# Patient Record
Sex: Male | Born: 1955 | Race: White | Hispanic: No | Smoking: Never smoker
Health system: Southern US, Community
[De-identification: ages and names within clinical notes are randomized; demographics above are authoritative.]

## PROBLEM LIST (undated history)

## (undated) DIAGNOSIS — M2550 Pain in unspecified joint: Secondary | ICD-10-CM

## (undated) HISTORY — PX: OTHER SURGICAL HISTORY: SHX169

## (undated) HISTORY — PX: COLONOSCOPY: SHX174

---

## 2014-02-22 ENCOUNTER — Emergency Department (HOSPITAL_COMMUNITY)

## 2014-02-22 ENCOUNTER — Emergency Department (HOSPITAL_COMMUNITY)
Admission: EM | Admit: 2014-02-22 | Discharge: 2014-02-22 | Disposition: A | Discharge status: Home / Self Care | Attending: Emergency Medicine | Admitting: Emergency Medicine

## 2014-02-22 ENCOUNTER — Encounter (HOSPITAL_COMMUNITY): Payer: Self-pay | Admitting: Emergency Medicine

## 2014-02-22 DIAGNOSIS — W000XXA Fall on same level due to ice and snow, initial encounter: Secondary | ICD-10-CM | POA: Diagnosis not present

## 2014-02-22 DIAGNOSIS — Y998 Other external cause status: Secondary | ICD-10-CM | POA: Diagnosis not present

## 2014-02-22 DIAGNOSIS — Y9289 Other specified places as the place of occurrence of the external cause: Secondary | ICD-10-CM | POA: Insufficient documentation

## 2014-02-22 DIAGNOSIS — Y9301 Activity, walking, marching and hiking: Secondary | ICD-10-CM | POA: Diagnosis not present

## 2014-02-22 DIAGNOSIS — S82842A Displaced bimalleolar fracture of left lower leg, initial encounter for closed fracture: Secondary | ICD-10-CM | POA: Insufficient documentation

## 2014-02-22 DIAGNOSIS — S99912A Unspecified injury of left ankle, initial encounter: Secondary | ICD-10-CM | POA: Diagnosis present

## 2014-02-22 MED ORDER — OXYCODONE-ACETAMINOPHEN 5-325 MG PO TABS
1.0000 | ORAL_TABLET | Freq: Once | ORAL | Status: AC
  Administered 2014-02-22: 1 via ORAL
  Filled 2014-02-22: qty 1

## 2014-02-22 MED ORDER — ONDANSETRON 4 MG PO TBDP
4.0000 mg | ORAL_TABLET | Freq: Once | ORAL | Status: DC
  Filled 2014-02-22: qty 1

## 2014-02-22 MED ORDER — ONDANSETRON HCL 4 MG PO TABS: 4.0000 mg | ORAL_TABLET | Freq: Four times a day (QID) | ORAL | Status: AC

## 2014-02-22 MED ORDER — IBUPROFEN 600 MG PO TABS: 600.0000 mg | ORAL_TABLET | Freq: Four times a day (QID) | ORAL | Status: DC | PRN

## 2014-02-22 MED ORDER — DOCUSATE SODIUM 250 MG PO CAPS: 250.0000 mg | ORAL_CAPSULE | Freq: Every day | ORAL | Status: AC

## 2014-02-22 MED ORDER — HYDROCODONE-ACETAMINOPHEN 5-325 MG PO TABS: 1.0000 | ORAL_TABLET | ORAL | Status: DC | PRN

## 2014-02-22 MED ORDER — IBUPROFEN 800 MG PO TABS
800.0000 mg | ORAL_TABLET | Freq: Once | ORAL | Status: AC
  Administered 2014-02-22: 800 mg via ORAL
  Filled 2014-02-22: qty 1

## 2014-02-22 NOTE — ED Notes (Signed)
Patient transported to X-ray 

## 2014-02-22 NOTE — ED Notes (Signed)
Pt had fall this morning on ice, c/o left foot and ankle pain. No deformity noted.

## 2014-02-22 NOTE — Discharge Instructions (Signed)
°  with your health care provider.

## 2014-02-22 NOTE — ED Provider Notes (Signed)
CSN: [id number]     Arrival date & time 02/22/14  4098 History   First MD Initiated Contact with Patient 02/22/14 [id number]     Chief Complaint  Patient presents with  . Fall  . Ankle Pain    left     (Consider location/radiation/quality/duration/timing/severity/associated sxs/prior Treatment) HPI   PCP: No primary care provider on file. Blood pressure 114/92, pulse 76, temperature 97.9 F (36.6 C), temperature source Oral, resp. rate 20, SpO2 100 %.  Jack Jones is a 59 y.o.male without any significant PMH presents to the ER with complaints of pain in the left ankle. The patient was walking into work when he slipped on a patch of dry ice causing him to fall and twist his left ankle. He denies hitting his head, injuring his neck, loss consciousness or any other complaints. He's been able to ambulate but with pain to the left ankle. He reports then like something is loose. The incident happened around 6:30 this morning. He originally went to urgent care but they do not open until 10 AM and he was concerned that his injury make it worse if he waited. He denies having any pain at rest. Has not taken anything for pain. He denies numbness or coldness to the foot.   History reviewed. No pertinent past medical history. History reviewed. No pertinent past surgical history. No family history on file. History  Substance Use Topics  . Smoking status: Never Smoker   . Smokeless tobacco: Not on file  . Alcohol Use: No    Review of Systems 10 Systems reviewed and are negative for acute change except as noted in the HPI.    Allergies  Review of patient's allergies indicates no known allergies.  Home Medications   Prior to Admission medications   Medication Sig Start Date End Date Taking? Authorizing Provider  docusate sodium (COLACE) 250 MG capsule Take 1 capsule (250 mg total) by mouth daily. 02/22/14   Dorthula Matas, PA-C  HYDROcodone-acetaminophen (NORCO/VICODIN) 5-325 MG per tablet  Take 1-2 tablets by mouth every 4 (four) hours as needed. 02/22/14   Estreya Clay Irine Seal, PA-C  ibuprofen (ADVIL,MOTRIN) 600 MG tablet Take 1 tablet (600 mg total) by mouth every 6 (six) hours as needed. 02/22/14   Hildreth Orsak Irine Seal, PA-C  ondansetron (ZOFRAN) 4 MG tablet Take 1 tablet (4 mg total) by mouth every 6 (six) hours. 02/22/14   Ladona Rosten Irine Seal, PA-C   BP 114/92 mmHg  Pulse 76  Temp(Src) 97.9 F (36.6 C) (Oral)  Resp 20  SpO2 100% Physical Exam  Constitutional: He appears well-developed and well-nourished. No distress.  HENT:  Head: Normocephalic and atraumatic. Head is without raccoon's eyes, without abrasion, without contusion, without laceration, without right periorbital erythema and without left periorbital erythema.  Right Ear: Tympanic membrane and ear canal normal.  Left Ear: Tympanic membrane and ear canal normal.  Nose: Nose normal.  Mouth/Throat: Uvula is midline and oropharynx is clear and moist.  Eyes: Pupils are equal, round, and reactive to light.  Neck: Normal range of motion. Neck supple.  Cardiovascular: Normal rate and regular rhythm.   Pulmonary/Chest: Effort normal.  Abdominal: Soft.  Musculoskeletal:       Left ankle: He exhibits decreased range of motion (due to pain and swelling) and swelling (lateral malleouli). He exhibits no deformity, no laceration and normal pulse. Tenderness. Lateral malleolus and medial malleolus tenderness found. Achilles tendon normal.  Pedal pulses are symmetrical. Foot is warm and moist. Sensations intact.  Neurological: He is alert.  Pt alert and oriented x 3 Upper and lower extremity strength is symmetrical and physiologic Normal muscular tone No facial droop   Skin: Skin is warm and dry.  Nursing note and vitals reviewed.   ED Course  Procedures (including critical care time) Labs Review Labs Reviewed - No data to display  Imaging Review Dg Ankle Complete Left  02/22/2014   ADDENDUM REPORT: 02/22/2014 10:22   ADDENDUM: Voice recognition error: The first sentence of the impression section should read "Bimalleolar fractures, oblique laterally and transverse medially. There is mild displacement of both fractures."   Electronically Signed   By: Gennette Pac M.D.   On: 02/22/2014 10:22   02/22/2014   CLINICAL DATA:  Larey Seat in parking lot at 7:30 a.m. today. Swelling of the left ankle. Fall from a standing position.  EXAM: LEFT ANKLE COMPLETE - 3+ VIEW  COMPARISON:  None.  FINDINGS: Bimalleolar fractures are slightly displaced. The lateral malleolar fracture is oblique. This suggests an eversion injury. Extensive soft tissue swelling is noted bilaterally. The ankle joint is located. No additional fractures are present. Prominent calcaneal spurs are noted.  IMPRESSION: 1. Bilateral now nail lower fractures, oblique laterally and transverse medially. There is mild displacement of both fractures. 2. The ankle joint is located. 3. Extensive soft tissue swelling is present.  Electronically Signed: By: Gennette Pac M.D. On: 02/22/2014 09:53   Dg Foot Complete Left  02/22/2014   CLINICAL DATA:  Recent fall with ankle pain and swelling, initial encounter  EXAM: LEFT FOOT - COMPLETE 3+ VIEW  COMPARISON:  None.  FINDINGS: The known distal tibial and fibular fractures are again seen. Generalized soft tissue swelling about the ankle is noted. No fractures are seen within the foot. Calcaneal spurs are noted.  IMPRESSION: Tibial and fibular fractures better evaluated on recent ankle images.  No other focal abnormality is noted.   Electronically Signed   By: Alcide Clever M.D.   On: 02/22/2014 09:53     EKG Interpretation None      MDM   Final diagnoses:  Bimalleolar ankle fracture, left, closed, initial encounter    Medications  oxyCODONE-acetaminophen (PERCOCET/ROXICET) 5-325 MG per tablet 1 tablet (not administered)  ondansetron (ZOFRAN-ODT) disintegrating tablet 4 mg (not administered)  ibuprofen (ADVIL,MOTRIN)  tablet 800 mg (800 mg Oral Given 02/22/14 0953)    Patient has a bimalleolar fracture. He has been given ice. I spoke with the orthopedic doctor who recommends posterior stirrup, heavy [padding, crutches, elevation, RICE. He is to call the office today to schedule follow-up appointment he will need surgery. Patient does not need relocation or emergent surgery as the ankle is only mildly displaced with no ankle joint dislocation. Large amount of swelling.  Discussed plan with patient and that the fracture is surgical, he has been given the orthopedics strict instructions.  59 y.o.Linard Millers Asare's evaluation in the Emergency Department is complete. It has been determined that no acute conditions requiring further emergency intervention are present at this time. The patient/guardian have been advised of the diagnosis and plan. We have discussed signs and symptoms that warrant return to the ED, such as changes or worsening in symptoms.  Vital signs are stable at discharge. Filed Vitals:   02/22/14 0853  BP: 114/92  Pulse: 76  Temp: 97.9 F (36.6 C)  Resp: 20    Patient/guardian has voiced understanding and agreed to follow-up with the PCP or specialist.    Dorthula Matas, PA-C 02/22/14  [address]  Mkenzie Dotts G Kamin Niblack, PA-C 02/22/14 1102  Suzi RootsKevin E Steinl, MD 02/23/14 [phone]

## 2014-03-01 ENCOUNTER — Other Ambulatory Visit (HOSPITAL_COMMUNITY): Payer: Self-pay | Admitting: Orthopaedic Surgery

## 2014-03-01 ENCOUNTER — Encounter (HOSPITAL_COMMUNITY): Payer: Self-pay

## 2014-03-01 ENCOUNTER — Encounter (HOSPITAL_COMMUNITY)
Admission: RE | Admit: 2014-03-01 | Discharge: 2014-03-01 | Disposition: A | Discharge status: Home / Self Care | Source: Ambulatory Visit | Attending: Orthopaedic Surgery | Admitting: Orthopaedic Surgery

## 2014-03-01 DIAGNOSIS — W000XXA Fall on same level due to ice and snow, initial encounter: Secondary | ICD-10-CM | POA: Insufficient documentation

## 2014-03-01 DIAGNOSIS — Z01812 Encounter for preprocedural laboratory examination: Secondary | ICD-10-CM | POA: Diagnosis present

## 2014-03-01 DIAGNOSIS — S82842A Displaced bimalleolar fracture of left lower leg, initial encounter for closed fracture: Secondary | ICD-10-CM | POA: Insufficient documentation

## 2014-03-01 HISTORY — DX: Pain in unspecified joint: M25.50

## 2014-03-01 LAB — SURGICAL PCR SCREEN
MRSA, PCR: NEGATIVE
Staphylococcus aureus: NEGATIVE

## 2014-03-01 LAB — CBC
HEMATOCRIT: 40.4 % (ref 39.0–52.0)
Hemoglobin: 13.5 g/dL (ref 13.0–17.0)
MCH: 30.2 pg (ref 26.0–34.0)
MCHC: 33.4 g/dL (ref 30.0–36.0)
MCV: 90.4 fL (ref 78.0–100.0)
Platelets: 169 10*3/uL (ref 150–400)
RBC: 4.47 MIL/uL (ref 4.22–5.81)
RDW: 12.2 % (ref 11.5–15.5)
WBC: 6.4 10*3/uL (ref 4.0–10.5)

## 2014-03-01 NOTE — Pre-Procedure Instructions (Signed)
Jack Jones  03/01/2014   Your procedure is scheduled on:  Tues, Feb 2 @ 3:10 PM  [facility]:00 PM.  Call this number if you have problems the morning of surgery: [phone]   Remember:   Do not eat food or drink liquids after midnight.   Take these medicines the morning of surgery with A SIP OF WATER: Pain Pill(if needed) and Zofran(Ondansetron-if needed)              Stop taking your Ibuprofen. No Goody's,BC's,Aleve,Aspirin,Fish Oil,or any Herbal Medications   Do not wear jewelry  Do not wear lotions, powders, or colognes. You may wear deodorant.  Men may shave face and neck.  Do not bring valuables to the hospital.  [facility] is not responsible                  for any belongings or valuables.               Contacts, dentures or bridgework may not be worn into surgery.  Leave suitcase in the car. After surgery it may be brought to your room.  For patients admitted to the hospital, discharge time is determined by your                treatment team.               Patients discharged the day of surgery will not be allowed to drive  home.    Special Instructions:  [facility] - Preparing for Surgery  Before surgery, you can play an important role.  Because skin is not sterile, your skin needs to be as free of germs as possible.  You can reduce the number of germs on you skin by washing with CHG (chlorahexidine gluconate) soap before surgery.  CHG is an antiseptic cleaner which kills germs and bonds with the skin to continue killing germs even after washing.  Please DO NOT use if you have an allergy to CHG or antibacterial soaps.  If your skin becomes reddened/irritated stop using the CHG and inform your nurse when you arrive at Short Stay.  Do not shave (including legs and underarms) for at least 48 hours prior to the first CHG shower.  You may shave your face.  Please follow these instructions carefully:   1.  Shower with CHG Soap the night before  surgery and the                                morning of Surgery.  2.  If you choose to wash your hair, wash your hair first as usual with your       normal shampoo.  3.  After you shampoo, rinse your hair and body thoroughly to remove the                      Shampoo.  4.  Use CHG as you would any other liquid soap.  You can apply chg directly       to the skin and wash gently with scrungie or a clean washcloth.  5.  Apply the CHG Soap to your body ONLY FROM THE NECK DOWN.        Do not use on open wounds or open sores.  Avoid contact with your eyes,       ears, mouth and genitals (private parts).  Wash genitals (private parts)       with your normal soap.  6.  Wash thoroughly, paying special attention to the area where your surgery        will be performed.  7.  Thoroughly rinse your body with warm water from the neck down.  8.  DO NOT shower/wash with your normal soap after using and rinsing off       the CHG Soap.  9.  Pat yourself dry with a clean towel.            10.  Wear clean pajamas.            11.  Place clean sheets on your bed the night of your first shower and do not        sleep with pets.  Day of Surgery  Do not apply any lotions/deoderants the morning of surgery.  Please wear clean clothes to the hospital/surgery center.     Please read over the following fact sheets that you were given: Coughing and Deep Breathing, MRSA Information and Surgical Site Infection Prevention

## 2014-03-01 NOTE — Progress Notes (Addendum)
Medical Md is Dr.Richard Escajeda  Pt doesn't have a cardiologist  Denies ever having an echo/stress test/heart cath  Denies EKG or CXR in past yr

## 2014-03-02 ENCOUNTER — Ambulatory Visit (HOSPITAL_COMMUNITY)
Admission: RE | Admit: 2014-03-02 | Discharge: 2014-03-02 | Disposition: A | Discharge status: Home / Self Care | Source: Ambulatory Visit | Attending: Orthopaedic Surgery | Admitting: Orthopaedic Surgery

## 2014-03-02 ENCOUNTER — Encounter (HOSPITAL_COMMUNITY): Payer: Self-pay

## 2014-03-02 ENCOUNTER — Ambulatory Visit (HOSPITAL_COMMUNITY): Admitting: Anesthesiology

## 2014-03-02 ENCOUNTER — Ambulatory Visit (HOSPITAL_COMMUNITY)

## 2014-03-02 ENCOUNTER — Encounter (HOSPITAL_COMMUNITY)
Admission: RE | Disposition: A | Discharge status: Home / Self Care | Payer: Self-pay | Source: Ambulatory Visit | Attending: Orthopaedic Surgery

## 2014-03-02 DIAGNOSIS — S82842A Displaced bimalleolar fracture of left lower leg, initial encounter for closed fracture: Secondary | ICD-10-CM

## 2014-03-02 DIAGNOSIS — Z419 Encounter for procedure for purposes other than remedying health state, unspecified: Secondary | ICD-10-CM

## 2014-03-02 DIAGNOSIS — W000XXA Fall on same level due to ice and snow, initial encounter: Secondary | ICD-10-CM | POA: Insufficient documentation

## 2014-03-02 HISTORY — PX: ORIF ANKLE FRACTURE: SHX5408

## 2014-03-02 SURGERY — OPEN REDUCTION INTERNAL FIXATION (ORIF) ANKLE FRACTURE
Anesthesia: Regional | Laterality: Left

## 2014-03-02 MED ORDER — FENTANYL CITRATE 0.05 MG/ML IJ SOLN
INTRAMUSCULAR | Status: AC
  Filled 2014-03-02: qty 5

## 2014-03-02 MED ORDER — ROPIVACAINE HCL 5 MG/ML IJ SOLN
INTRAMUSCULAR | Status: DC | PRN
  Administered 2014-03-02: 30 mL via PERINEURAL
  Administered 2014-03-02: 15 mL via PERINEURAL

## 2014-03-02 MED ORDER — CEFAZOLIN SODIUM-DEXTROSE 2-3 GM-% IV SOLR
INTRAVENOUS | Status: AC
  Administered 2014-03-02: 2 g via INTRAVENOUS
  Filled 2014-03-02: qty 50

## 2014-03-02 MED ORDER — OXYCODONE HCL 5 MG PO TABS
5.0000 mg | ORAL_TABLET | Freq: Once | ORAL | Status: DC | PRN
Start: 2014-03-02 — End: 2014-03-02

## 2014-03-02 MED ORDER — LACTATED RINGERS IV SOLN
INTRAVENOUS | Status: DC | PRN
  Administered 2014-03-02 (×2): via INTRAVENOUS

## 2014-03-02 MED ORDER — ACETAMINOPHEN 325 MG PO TABS: 325.0000 mg | ORAL_TABLET | ORAL | Status: DC | PRN

## 2014-03-02 MED ORDER — OXYCODONE HCL 5 MG/5ML PO SOLN: 5.0000 mg | Freq: Once | ORAL | Status: DC | PRN

## 2014-03-02 MED ORDER — LACTATED RINGERS IV SOLN
INTRAVENOUS | Status: DC
  Administered 2014-03-02: 13:00:00 via INTRAVENOUS

## 2014-03-02 MED ORDER — OXYCODONE-ACETAMINOPHEN 5-325 MG PO TABS: 1.0000 | ORAL_TABLET | ORAL | Status: AC | PRN

## 2014-03-02 MED ORDER — PHENYLEPHRINE HCL 10 MG/ML IJ SOLN
INTRAMUSCULAR | Status: DC | PRN
  Administered 2014-03-02: 40 ug via INTRAVENOUS

## 2014-03-02 MED ORDER — LIDOCAINE HCL (CARDIAC) 20 MG/ML IV SOLN
INTRAVENOUS | Status: DC | PRN
  Administered 2014-03-02: 70 mg via INTRAVENOUS

## 2014-03-02 MED ORDER — HYDROMORPHONE HCL 1 MG/ML IJ SOLN: 0.2500 mg | INTRAMUSCULAR | Status: DC | PRN

## 2014-03-02 MED ORDER — EPHEDRINE SULFATE 50 MG/ML IJ SOLN
INTRAMUSCULAR | Status: DC | PRN
  Administered 2014-03-02: 10 mg via INTRAVENOUS
  Administered 2014-03-02 (×8): 5 mg via INTRAVENOUS

## 2014-03-02 MED ORDER — MIDAZOLAM HCL 5 MG/5ML IJ SOLN
INTRAMUSCULAR | Status: DC | PRN
  Administered 2014-03-02: 2 mg via INTRAVENOUS

## 2014-03-02 MED ORDER — ACETAMINOPHEN 160 MG/5ML PO SOLN
325.0000 mg | ORAL | Status: DC | PRN
  Filled 2014-03-02: qty 20.3

## 2014-03-02 MED ORDER — MIDAZOLAM HCL 2 MG/2ML IJ SOLN
INTRAMUSCULAR | Status: AC
  Filled 2014-03-02: qty 2

## 2014-03-02 MED ORDER — ONDANSETRON HCL 4 MG/2ML IJ SOLN
INTRAMUSCULAR | Status: DC | PRN
  Administered 2014-03-02: 4 mg via INTRAVENOUS

## 2014-03-02 MED ORDER — PROPOFOL 10 MG/ML IV BOLUS
INTRAVENOUS | Status: DC | PRN
  Administered 2014-03-02: 180 mg via INTRAVENOUS

## 2014-03-02 MED ORDER — BUPIVACAINE HCL (PF) 0.25 % IJ SOLN
INTRAMUSCULAR | Status: AC
  Filled 2014-03-02: qty 30

## 2014-03-02 MED ORDER — CEFAZOLIN SODIUM-DEXTROSE 2-3 GM-% IV SOLR: 2.0000 g | INTRAVENOUS | Status: DC

## 2014-03-02 MED ORDER — FENTANYL CITRATE 0.05 MG/ML IJ SOLN
INTRAMUSCULAR | Status: DC | PRN
  Administered 2014-03-02: 50 ug via INTRAVENOUS

## 2014-03-02 MED ORDER — 0.9 % SODIUM CHLORIDE (POUR BTL) OPTIME
TOPICAL | Status: DC | PRN
  Administered 2014-03-02: 1000 mL

## 2014-03-02 MED ORDER — ASPIRIN EC 325 MG PO TBEC: 325.0000 mg | DELAYED_RELEASE_TABLET | Freq: Every day | ORAL | Status: AC

## 2014-03-02 SURGICAL SUPPLY — 69 items
BANDAGE ELASTIC 4 VELCRO ST LF (GAUZE/BANDAGES/DRESSINGS) IMPLANT
BANDAGE ELASTIC 6 VELCRO ST LF (GAUZE/BANDAGES/DRESSINGS) IMPLANT
BANDAGE ESMARK 6X9 LF (GAUZE/BANDAGES/DRESSINGS) IMPLANT
BIT DRILL 2.5X110 QC LCP DISP (BIT) ×2 IMPLANT
BIT DRILL CANN 2.7X625 NONSTRL (BIT) ×3 IMPLANT
BNDG CMPR 9X6 STRL LF SNTH (GAUZE/BANDAGES/DRESSINGS)
BNDG ESMARK 6X9 LF (GAUZE/BANDAGES/DRESSINGS)
BNDG GAUZE ELAST 4 BULKY (GAUZE/BANDAGES/DRESSINGS) ×4 IMPLANT
COVER SURGICAL LIGHT HANDLE (MISCELLANEOUS) ×3 IMPLANT
CUFF TOURNIQUET SINGLE 34IN LL (TOURNIQUET CUFF) IMPLANT
CUFF TOURNIQUET SINGLE 44IN (TOURNIQUET CUFF) IMPLANT
DRAPE C-ARM 42X72 X-RAY (DRAPES) ×3 IMPLANT
DRAPE U-SHAPE 47X51 STRL (DRAPES) ×3 IMPLANT
DRSG PAD ABDOMINAL 8X10 ST (GAUZE/BANDAGES/DRESSINGS) ×4 IMPLANT
DURAPREP 26ML APPLICATOR (WOUND CARE) ×3 IMPLANT
ELECT REM PT RETURN 9FT ADLT (ELECTROSURGICAL) ×3
ELECTRODE REM PT RTRN 9FT ADLT (ELECTROSURGICAL) ×1 IMPLANT
GAUZE SPONGE 4X4 12PLY STRL (GAUZE/BANDAGES/DRESSINGS) ×1 IMPLANT
GAUZE XEROFORM 5X9 LF (GAUZE/BANDAGES/DRESSINGS) ×3 IMPLANT
GLOVE BIO SURGEON STRL SZ8 (GLOVE) ×1 IMPLANT
GLOVE BIOGEL PI IND STRL 8 (GLOVE) IMPLANT
GLOVE BIOGEL PI INDICATOR 8 (GLOVE) ×4
GLOVE ORTHO TXT STRL SZ7.5 (GLOVE) ×3 IMPLANT
GLOVE SURG SS PI 6.5 STRL IVOR (GLOVE) ×2 IMPLANT
GLOVE SURG SS PI 7.0 STRL IVOR (GLOVE) ×6 IMPLANT
GOWN STRL REUS W/ TWL LRG LVL3 (GOWN DISPOSABLE) ×2 IMPLANT
GOWN STRL REUS W/ TWL XL LVL3 (GOWN DISPOSABLE) ×4 IMPLANT
GOWN STRL REUS W/TWL LRG LVL3 (GOWN DISPOSABLE)
GOWN STRL REUS W/TWL XL LVL3 (GOWN DISPOSABLE) ×6
GUIDEWIRE THREADED 150MM (WIRE) ×2 IMPLANT
KIT BASIN OR (CUSTOM PROCEDURE TRAY) ×3 IMPLANT
KIT ROOM TURNOVER OR (KITS) ×3 IMPLANT
MANIFOLD NEPTUNE II (INSTRUMENTS) ×1 IMPLANT
NEEDLE HYPO 25GX1X1/2 BEV (NEEDLE) IMPLANT
NS IRRIG 1000ML POUR BTL (IV SOLUTION) ×3 IMPLANT
PACK ORTHO EXTREMITY (CUSTOM PROCEDURE TRAY) ×3 IMPLANT
PAD ARMBOARD 7.5X6 YLW CONV (MISCELLANEOUS) ×6 IMPLANT
PAD CAST 4YDX4 CTTN HI CHSV (CAST SUPPLIES) ×2 IMPLANT
PADDING CAST COTTON 4X4 STRL (CAST SUPPLIES) ×6
PADDING CAST COTTON 6X4 STRL (CAST SUPPLIES) ×3 IMPLANT
PLATE LCP 3.5 1/3 TUB 6HX69 (Plate) ×2 IMPLANT
PREFILTER NEPTUNE (MISCELLANEOUS) IMPLANT
SCREW CANC PT/18 4.0 (Screw) ×2 IMPLANT
SCREW CANCEL 4.0 16MM (Screw) ×2 IMPLANT
SCREW CANN S THRD/50 4.0 (Screw) ×6 IMPLANT
SCREW CORTEX 3.5 12MM (Screw) ×2 IMPLANT
SCREW CORTEX 3.5 14MM (Screw) ×2 IMPLANT
SCREW CORTEX 3.5 18MM (Screw) ×2 IMPLANT
SCREW LOCK CORT ST 3.5X12 (Screw) IMPLANT
SCREW LOCK CORT ST 3.5X14 (Screw) IMPLANT
SCREW LOCK CORT ST 3.5X18 (Screw) ×1 IMPLANT
SPONGE GAUZE 4X4 12PLY STER LF (GAUZE/BANDAGES/DRESSINGS) ×2 IMPLANT
SPONGE LAP 4X18 X RAY DECT (DISPOSABLE) ×2 IMPLANT
SUCTION FRAZIER TIP 10 FR DISP (SUCTIONS) ×3 IMPLANT
SUT ETHILON 2 0 FS 18 (SUTURE) ×3 IMPLANT
SUT ETHILON 3 0 PS 1 (SUTURE) ×6 IMPLANT
SUT ETHILON 4 0 PS 2 18 (SUTURE) ×4 IMPLANT
SUT VIC AB 0 CT1 27 (SUTURE) ×3
SUT VIC AB 0 CT1 27XBRD ANBCTR (SUTURE) ×1 IMPLANT
SUT VIC AB 2-0 CT1 27 (SUTURE) ×6
SUT VIC AB 2-0 CT1 27XBRD (SUTURE) ×1 IMPLANT
SUT VIC AB 2-0 CT1 TAPERPNT 27 (SUTURE) IMPLANT
SUT VICRYL 0 CT 1 36IN (SUTURE) ×3 IMPLANT
SYR CONTROL 10ML LL (SYRINGE) IMPLANT
TOWEL OR 17X24 6PK STRL BLUE (TOWEL DISPOSABLE) ×3 IMPLANT
TOWEL OR 17X26 10 PK STRL BLUE (TOWEL DISPOSABLE) ×3 IMPLANT
TUBE CONNECTING 12'X1/4 (SUCTIONS) ×1
TUBE CONNECTING 12X1/4 (SUCTIONS) ×2 IMPLANT
WATER STERILE IRR 1000ML POUR (IV SOLUTION) ×3 IMPLANT

## 2014-03-02 NOTE — Discharge Instructions (Signed)
Elevation and ice for swelling. No weight at all on your left ankle. Keep your splint clean and dry.  General Anesthesia, Adult, Care After  Refer to this sheet in the next few weeks. These instructions provide you with information on caring for yourself after your procedure. Your health care provider may also give you more specific instructions. Your treatment has been planned according to current medical practices, but problems sometimes occur. Call your health care provider if you have any problems or questions after your procedure.  WHAT TO EXPECT AFTER THE PROCEDURE  After the procedure, it is typical to experience:  Sleepiness.  Nausea and vomiting. HOME CARE INSTRUCTIONS  For the first 24 hours after general anesthesia:  Have a responsible person with you.  Do not drive a car. If you are alone, do not take public transportation.  Do not drink alcohol.  Do not take medicine that has not been prescribed by your health care provider.  Do not sign important papers or make important decisions.  You may resume a normal diet and activities as directed by your health care provider.  Change bandages (dressings) as directed.  If you have questions or problems that seem related to general anesthesia, call the hospital and ask for the anesthetist or anesthesiologist on call. SEEK MEDICAL CARE IF:  You have nausea and vomiting that continue the day after anesthesia.  You develop a rash. SEEK IMMEDIATE MEDICAL CARE IF:  You have difficulty breathing.  You have chest pain.  You have any allergic problems. Document Released: 04/23/2000 Document Revised: 09/17/2012 Document Reviewed: 07/31/2012  [employer] Patient Information 2014 [employer] [place].   What to eat:  For your first meals, you should eat lightly; only small meals initially.  If you do not have nausea, you may eat larger meals.  Avoid spicy, greasy and heavy food.

## 2014-03-02 NOTE — Anesthesia Preprocedure Evaluation (Signed)
Anesthesia Evaluation  Patient identified by MRN, date of birth, ID band Patient awake    Reviewed: Allergy & Precautions, NPO status , Patient's Chart, lab work & pertinent test results  History of Anesthesia Complications Negative for: history of anesthetic complications  Airway Mallampati: II  TM Distance: >3 FB Neck ROM: Full    Dental  (+) Teeth Intact   Pulmonary neg pulmonary ROS,  breath sounds clear to auscultation        Cardiovascular negative cardio ROS  Rhythm:Regular     Neuro/Psych negative neurological ROS  negative psych ROS   GI/Hepatic negative GI ROS, Neg liver ROS,   Endo/Other  negative endocrine ROS  Renal/GU negative Renal ROS     Musculoskeletal Left ankle fracture   Abdominal   Peds  Hematology negative hematology ROS (+)   Anesthesia Other Findings   Reproductive/Obstetrics                             Anesthesia Physical Anesthesia Plan  ASA: I  Anesthesia Plan: General and Regional   Post-op Pain Management:    Induction: Intravenous  Airway Management Planned: LMA  Additional Equipment: None  Intra-op Plan:   Post-operative Plan: Extubation in OR  Informed Consent: I have reviewed the patients History and Physical, chart, labs and discussed the procedure including the risks, benefits and alternatives for the proposed anesthesia with the patient or authorized representative who has indicated his/her understanding and acceptance.   Dental advisory given  Plan Discussed with: CRNA and Surgeon  Anesthesia Plan Comments:         Anesthesia Quick Evaluation

## 2014-03-02 NOTE — Brief Op Note (Signed)
03/02/2014  3:52 PM  PATIENT:  Carita Pianandy L Edell  59 y.o. male  PRE-OPERATIVE DIAGNOSIS:  left bimalleolar ankle fracture  POST-OPERATIVE DIAGNOSIS:  left bimalleolar ankle fracture  PROCEDURE:  Procedure(s): OPEN REDUCTION INTERNAL FIXATION (ORIF) LEFT BIMALLEOLAR ANKLE FRACTURE (Left)  SURGEON:  Surgeon(s) and Role:    * Kathryne Hitchhristopher Y Blackman, MD - Primary  PHYSICIAN ASSISTANT: Rexene EdisonGil Clark, PA-C  ANESTHESIA:   regional and general  EBL:  Total I/O In: 1200 [I.V.:1200] Out: 25 [Blood:25]  BLOOD ADMINISTERED:none  DRAINS: none   LOCAL MEDICATIONS USED:  NONE  SPECIMEN:  No Specimen  DISPOSITION OF SPECIMEN:  N/A  COUNTS:  YES  TOURNIQUET:   Total Tourniquet Time Documented: Thigh (Left) - 80 minutes Total: Thigh (Left) - 80 minutes   DICTATION: .Other Dictation: Dictation Number [id number]  PLAN OF CARE:  Discharge to home from PACU  PATIENT DISPOSITION:  PACU - hemodynamically stable.   Delay start of Pharmacological VTE agent (>24hrs) due to surgical blood loss or risk of bleeding: not applicable

## 2014-03-02 NOTE — Anesthesia Postprocedure Evaluation (Signed)
  Anesthesia Post-op Note  Patient: Jack PianRandy L Ostrom  Procedure(s) Performed: Procedure(s): OPEN REDUCTION INTERNAL FIXATION (ORIF) LEFT BIMALLEOLAR ANKLE FRACTURE (Left)  Patient Location: PACU  Anesthesia Type:General and block  Level of Consciousness: awake and alert   Airway and Oxygen Therapy: Patient Spontanous Breathing  Post-op Pain: none  Post-op Assessment: Post-op Vital signs reviewed, Patient's Cardiovascular Status Stable and Respiratory Function Stable  Post-op Vital Signs: Reviewed  Filed Vitals:   03/02/14 1653  BP: 130/73  Pulse: 84  Temp:   Resp: 14    Complications: No apparent anesthesia complications

## 2014-03-02 NOTE — Transfer of Care (Signed)
Immediate Anesthesia Transfer of Care Note  Patient: Jack Jones  Procedure(s) Performed: Procedure(s): OPEN REDUCTION INTERNAL FIXATION (ORIF) LEFT BIMALLEOLAR ANKLE FRACTURE (Left)  Patient Location: PACU  Anesthesia Type:General and Regional  Level of Consciousness: awake, alert  and oriented  Airway & Oxygen Therapy: Patient Spontanous Breathing and Patient connected to nasal cannula oxygen  Post-op Assessment: Report given to RN, Post -op Vital signs reviewed and stable and Patient moving all extremities X 4  Post vital signs: Reviewed and stable  Last Vitals:  Filed Vitals:   03/02/14 1608  BP:   Pulse: 91  Temp: 36.6 C  Resp: 17    Complications: No apparent anesthesia complications

## 2014-03-02 NOTE — H&P (Signed)
Jack PianRandy L Jones is an 59 y.o. male.   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  Was seen in the ED and found to have a displaced left ankle blmalleolar fracture.  He was treated initially in a splint with non-weight bearing and ice/elevation.  Due to the unstable nature of this fracture, surgery has been recommended.  He understands fully the risk of vessel and nerve injury, infection, non-union, and post-traumatic arthritis.  Past Medical History  Diagnosis Date  . Joint pain     Past Surgical History  Procedure Laterality Date  . 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 Colonoscopy      History reviewed. No pertinent family history. Social History:  reports that he has never smoked. He does not have any smokeless tobacco history on file. He reports that he does not drink alcohol or use illicit drugs.  Allergies: No Known Allergies  Medications Prior to Admission  Medication Sig Dispense Refill  . docusate sodium (COLACE) 250 MG capsule Take 1 capsule (250 mg total) by mouth daily. 14 capsule 0  . HYDROcodone-acetaminophen (NORCO/VICODIN) 5-325 MG per tablet Take 1-2 tablets by mouth every 4 (four) hours as needed. (Patient taking differently: Take 1-2 tablets by mouth every 4 (four) hours as needed (pain). ) 30 tablet 0  . ibuprofen (ADVIL,MOTRIN) 600 MG tablet Take 1 tablet (600 mg total) by mouth every 6 (six) hours as needed. (Patient taking differently: Take 600 mg by mouth every 6 (six) hours as needed (pain). ) 30 tablet 0  . ondansetron (ZOFRAN) 4 MG tablet Take 1 tablet (4 mg total) by mouth every 6 (six) hours. 12 tablet 0    Results for orders placed or performed during the hospital encounter of 03/01/14 (from the past 48 hour(s))  Surgical pcr screen     Status: None   Collection Time: 03/01/14  2:16 PM  Result Value Ref Range   MRSA, PCR NEGATIVE NEGATIVE    Staphylococcus aureus NEGATIVE NEGATIVE    Comment:        The Xpert SA Assay (FDA approved for NASAL specimens in patients over 11021 years of age), is one component of a comprehensive surveillance program.  Test performance has been validated by [facility] for patients greater than or equal to 60 year old. It is not intended to diagnose infection nor to guide or monitor treatment.   CBC     Status: None   Collection Time: 03/01/14  2:16 PM  Result Value Ref Range   WBC 6.4 4.0 - 10.5 K/uL   RBC 4.47 4.22 - 5.81 MIL/uL   Hemoglobin 13.5 13.0 - 17.0 g/dL   HCT 95.240.4 84.139.0 - 32.452.0 %   MCV 90.4 78.0 - 100.0 fL   MCH 30.2 26.0 - 34.0 pg   MCHC 33.4 30.0 - 36.0 g/dL   RDW 40.112.2 02.711.5 - 25.315.5 %   Platelets 169 150 - 400 K/uL   No results found.  Review of Systems  All other systems reviewed and are negative.   Blood pressure 140/93, pulse 88, temperature 98.4 F (36.9 C), temperature source Oral, resp. rate 18, height 6\' 3"  (1.905 m), weight 85.911 kg (189 lb 6.4 oz), SpO2 99 %. Physical Exam  Constitutional: He is oriented to person, place, and time. He appears well-developed and well-nourished.  HENT:  Head: Normocephalic  and atraumatic.  Eyes: EOM are normal. Pupils are equal, round, and reactive to light.  Neck: Normal range of motion. Neck supple.  Cardiovascular: Normal rate and regular rhythm.   Respiratory: Effort normal and breath sounds normal.  GI: Soft. Bowel sounds are normal.  Musculoskeletal:       Left ankle: He exhibits decreased range of motion, swelling and ecchymosis. Tenderness. Lateral malleolus and medial malleolus tenderness found.  Neurological: He is alert and oriented to person, place, and time.  Skin: Skin is warm and dry.  Psychiatric: He has a normal mood and affect.     Assessment/Plan Closed, displaced left ankle bimalleolar fracture s/p mechanical fall 1)  To the OR today for open reduction/internal fixation of his left ankle  fracture  Domanique Luckett Y 03/02/2014, 1:50 PM

## 2014-03-02 NOTE — Anesthesia Procedure Notes (Addendum)
Procedure Name: LMA Insertion Date/Time: 03/02/2014 2:16 PM Performed by: Rise PatienceBELL, SARAH T Pre-anesthesia Checklist: Patient identified, Emergency Drugs available, Suction available and Patient being monitored Patient Re-evaluated:Patient Re-evaluated prior to inductionOxygen Delivery Method: Circle system utilized Preoxygenation: Pre-oxygenation with 100% oxygen Intubation Type: IV induction LMA: LMA inserted LMA Size: 4.0 Number of attempts: 1 Placement Confirmation: positive ETCO2 and breath sounds checked- equal and bilateral Tube secured with: Tape Dental Injury: Teeth and Oropharynx as per pre-operative assessment     Anesthesia Regional Block:  Popliteal block  Pre-Anesthetic Checklist: ,, timeout performed, Correct Patient, Correct Site, Correct Laterality, Correct Procedure, Correct Position, site marked, Risks and benefits discussed,  Surgical consent,  Pre-op evaluation,  At surgeon's request and post-op pain management  Laterality: Lower and Left  Prep: chloraprep       Needles:  Injection technique: Single-shot  Needle Type: Echogenic Stimulator Needle          Additional Needles:  Procedures: ultrasound guided (picture in chart) and nerve stimulator Popliteal block  Nerve Stimulator or Paresthesia:  Response: plantar, 0.5 mA,   Additional Responses:   Narrative:  Injection made incrementally with aspirations every 5 mL.  Performed by: Personally   Additional Notes: H+P and labs reviewed, risks and benefits discussed with patient, procedure tolerated well without complications. Single shot left saphenous nerve block with US guidance preformed without complication and negative aspiration every 5 ml
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
## 2014-03-04 ENCOUNTER — Encounter (HOSPITAL_COMMUNITY): Payer: Self-pay | Admitting: Orthopaedic Surgery

## 2016-11-21 IMAGING — CR DG FOOT COMPLETE 3+V*L*
4 series · 4 of 4 positions shown · non-contrast
Comparison: None.

CLINICAL DATA: Recent fall with ankle pain and swelling, initial
encounter

EXAM:
LEFT FOOT - COMPLETE 3+ VIEW

[t foot ap left]
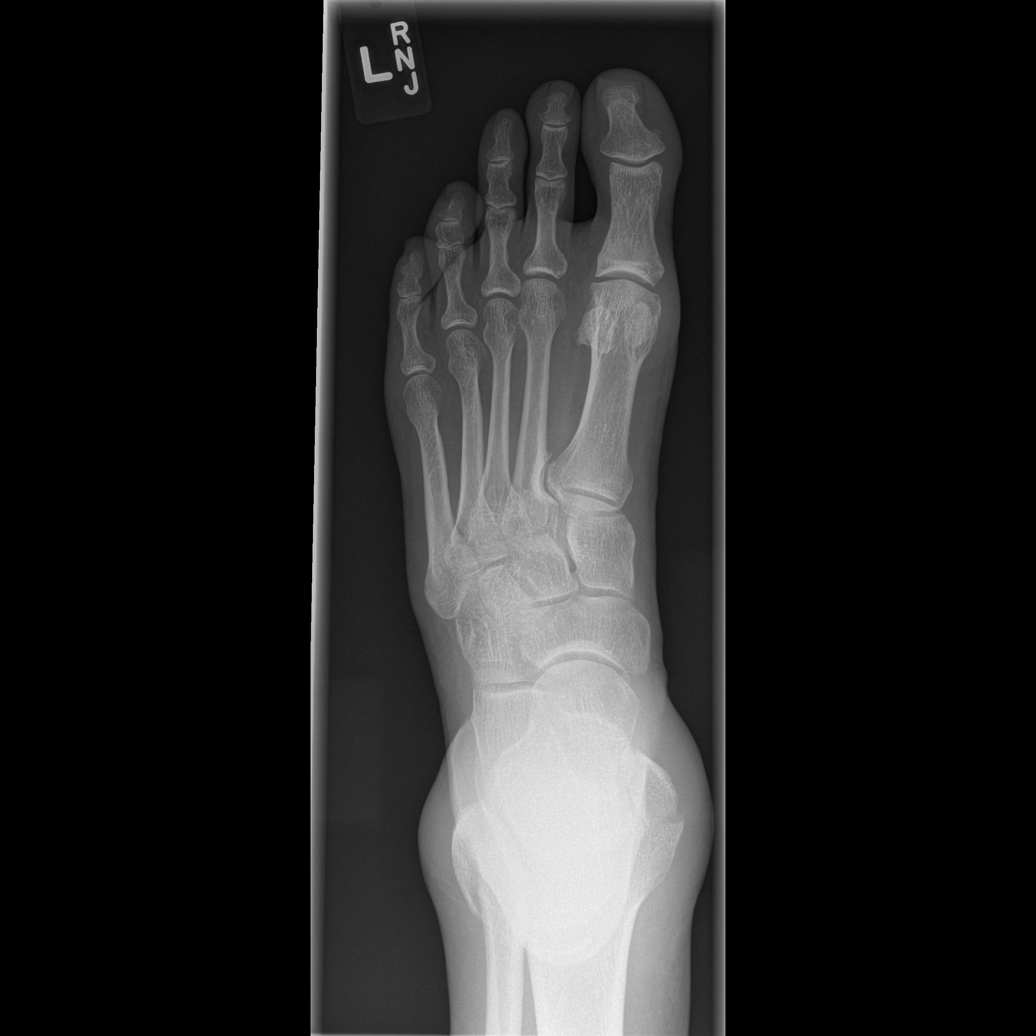

[t foot oblique left]
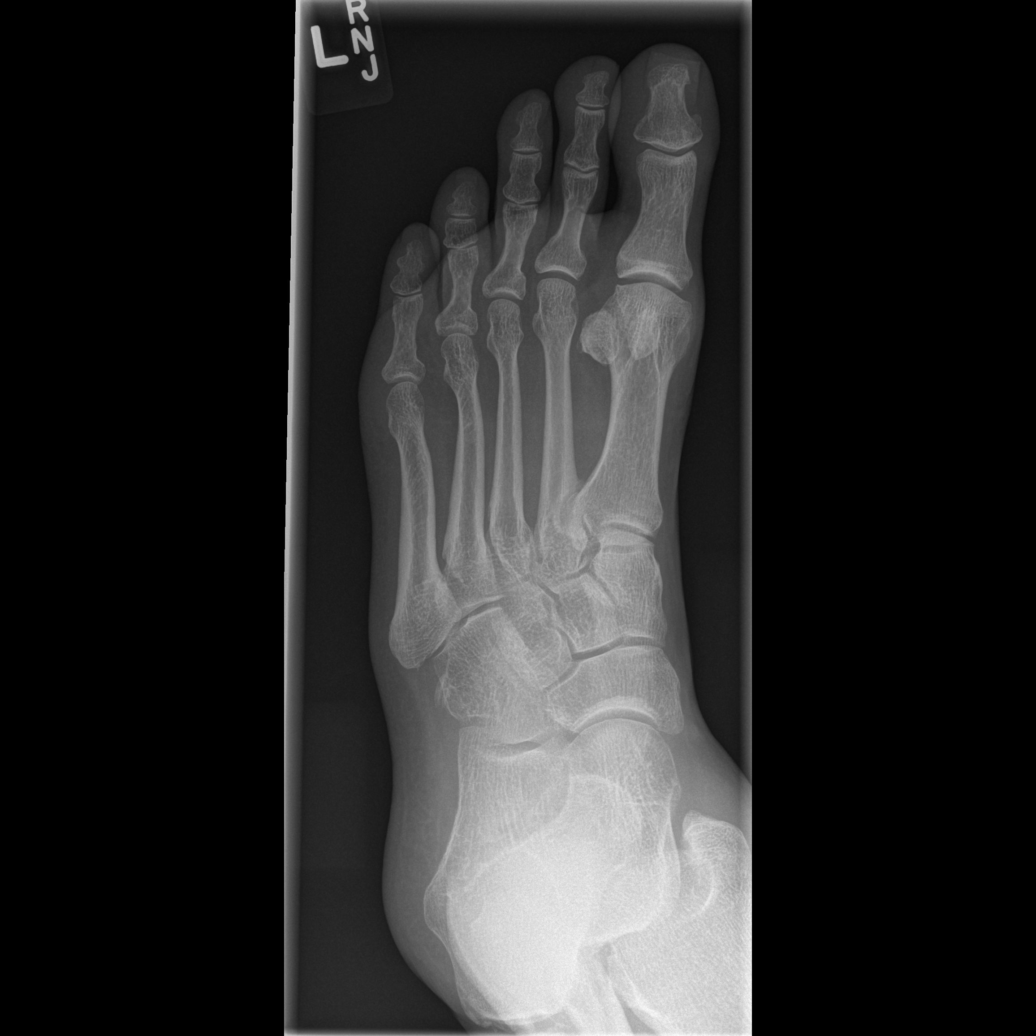

[t foot lat left (1 of 2)]
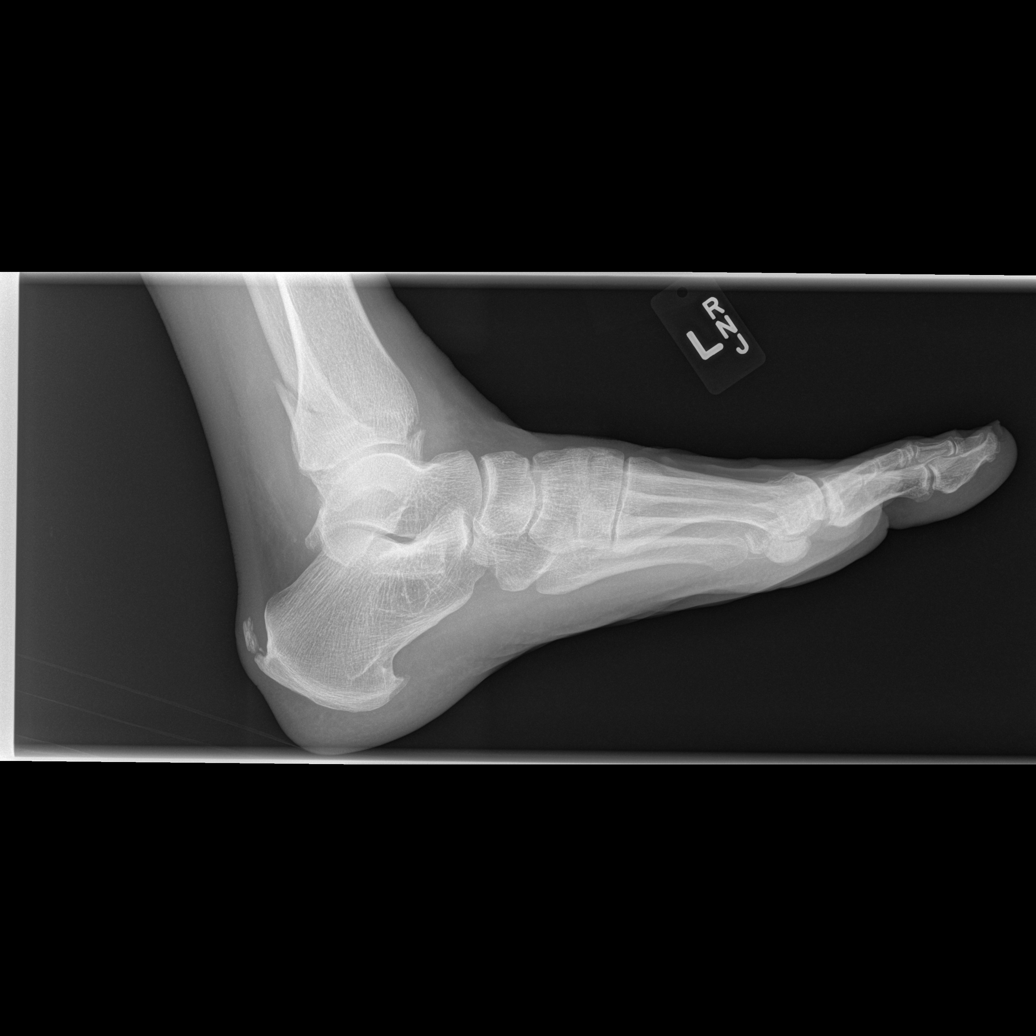

[t foot lat left (2 of 2)]
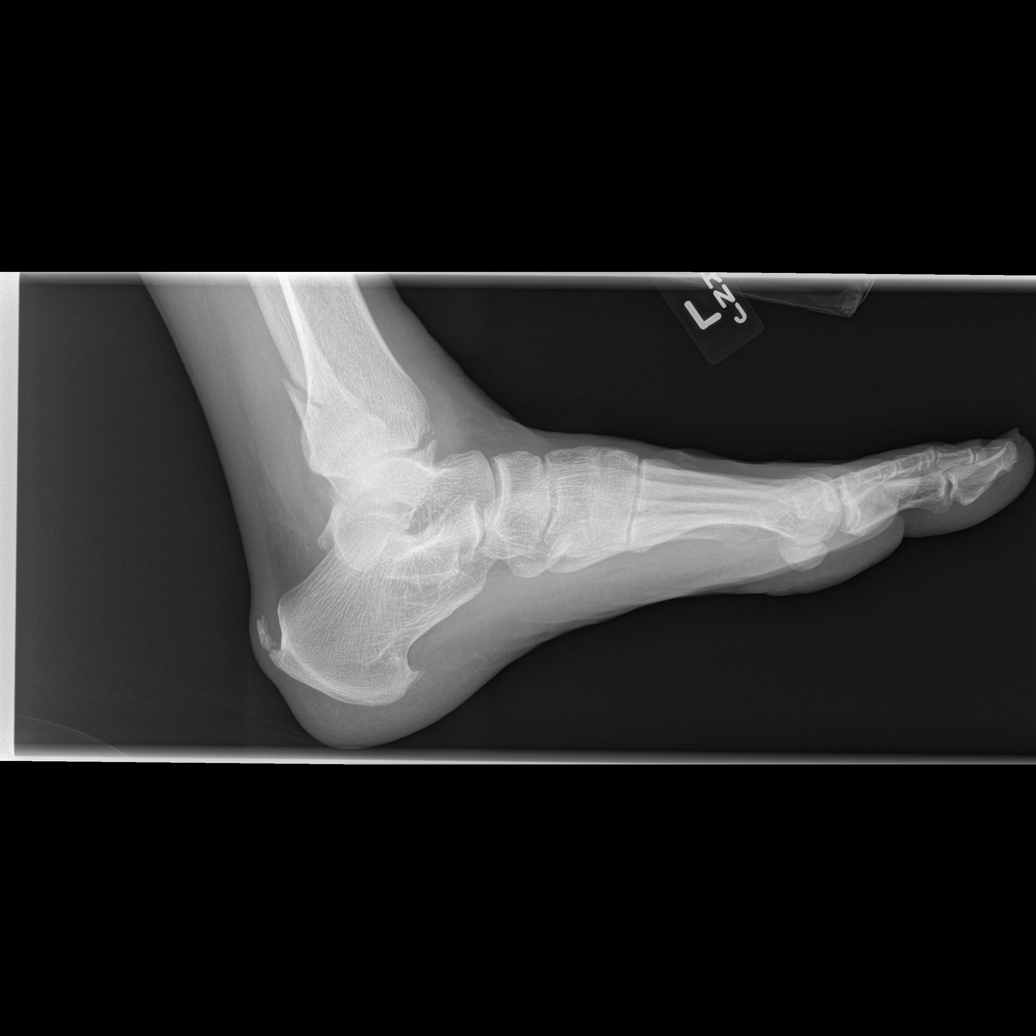

[4 of 4 positions shown; findings below may reference images not displayed]

FINDINGS: The known distal tibial and fibular fractures are again seen.
Generalized soft tissue swelling about the ankle is noted. No
fractures are seen within the foot. Calcaneal spurs are noted.
IMPRESSION: Tibial and fibular fractures better evaluated on recent ankle
images.

No other focal abnormality is noted.

## 2016-11-29 IMAGING — RF DG ANKLE COMPLETE 3+V*L*
1 series · 4 of 4 positions shown · non-contrast
Comparison: None

CLINICAL DATA: Left ankle ORIF

EXAM:
DG C-ARM 61-120 MIN; LEFT ANKLE COMPLETE - 3+ VIEW
FLUOROSCOPY TIME:  If the device does not provide the exposure
index:
Fluoroscopy Time (in minutes and seconds):  1 min 20 seconds
Number of Acquired Images:  4

[Series 1: run · 4 of 4 slices shown]
[im 1/4]
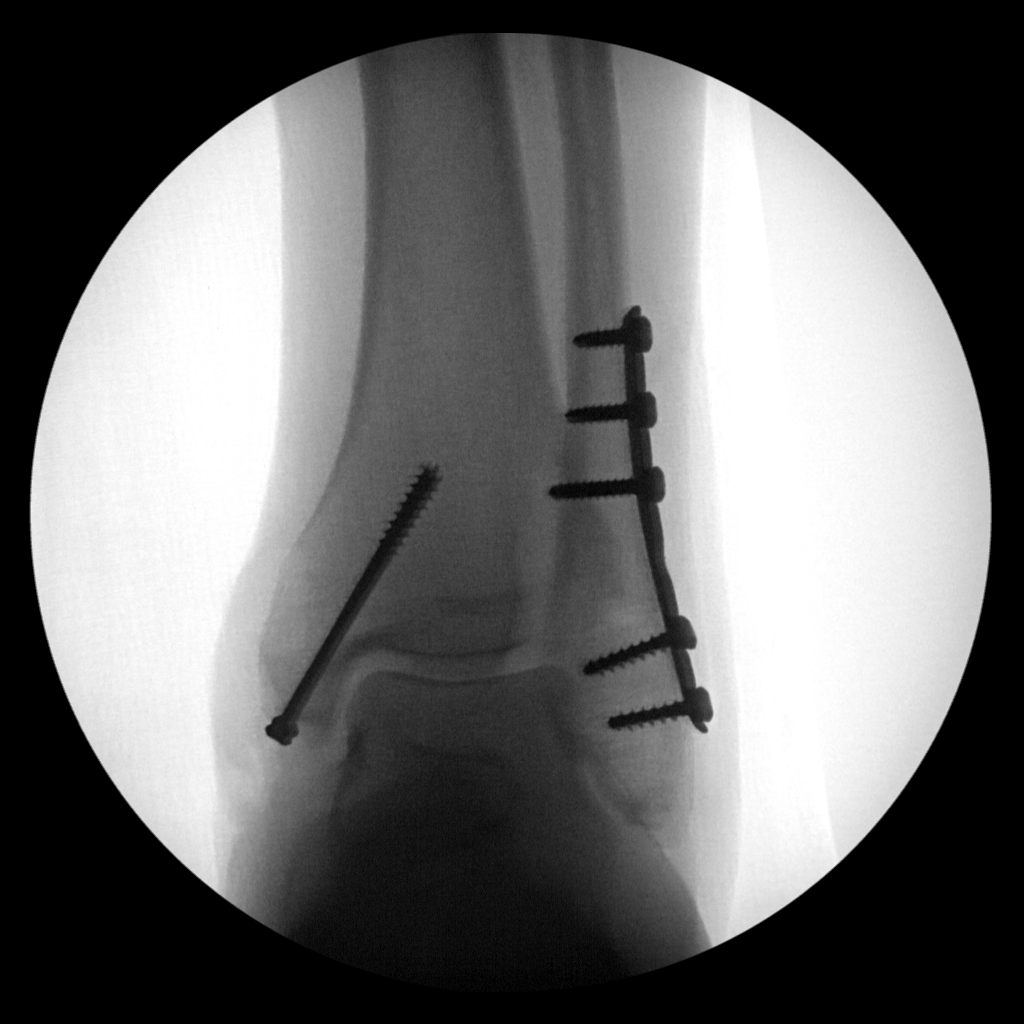
[im 2/4]
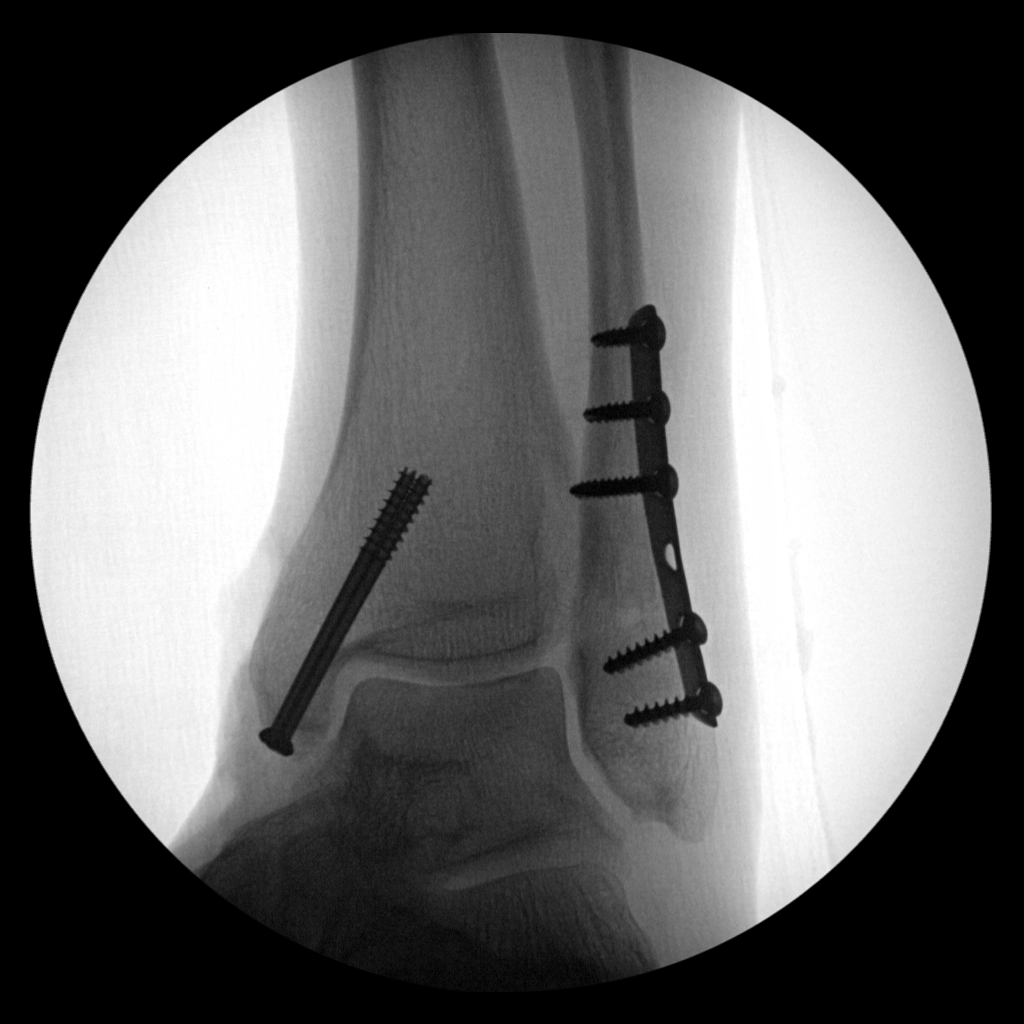
[im 3/4]
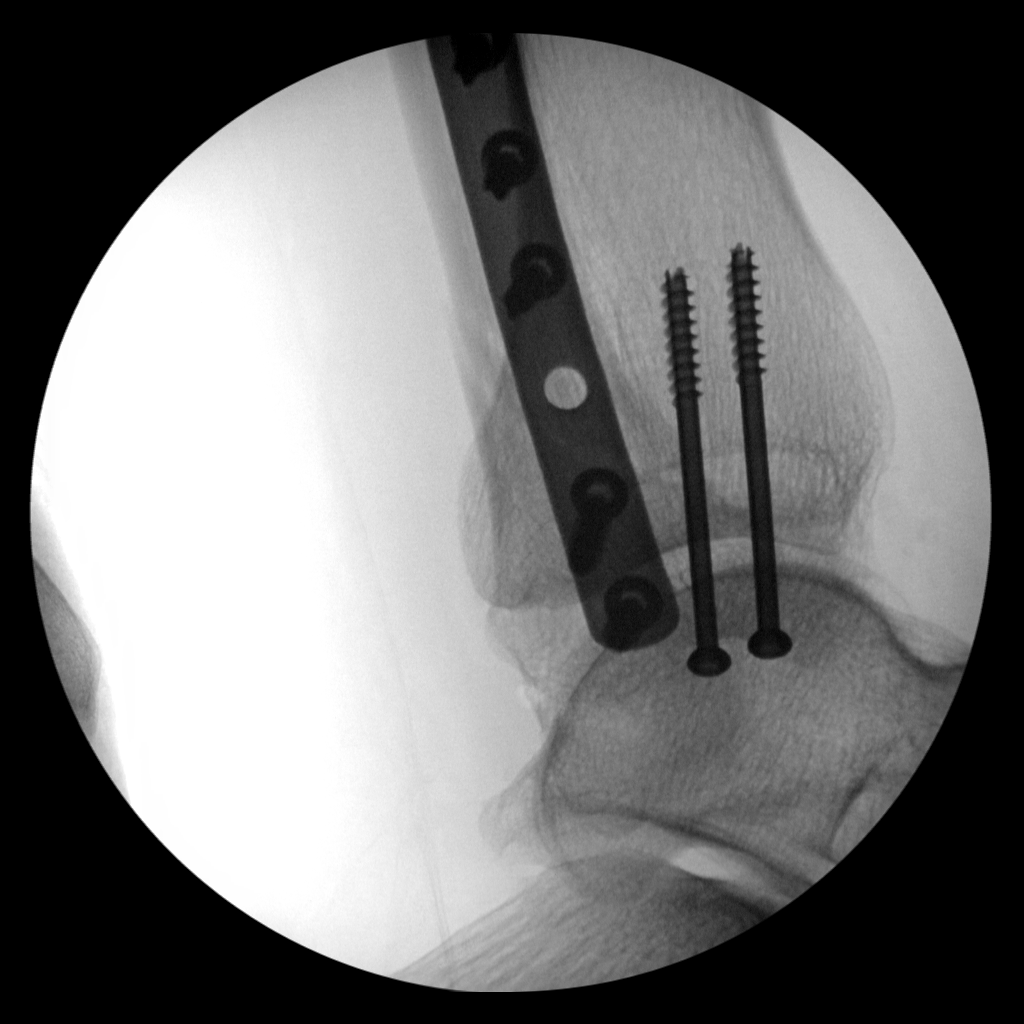
[im 4/4]
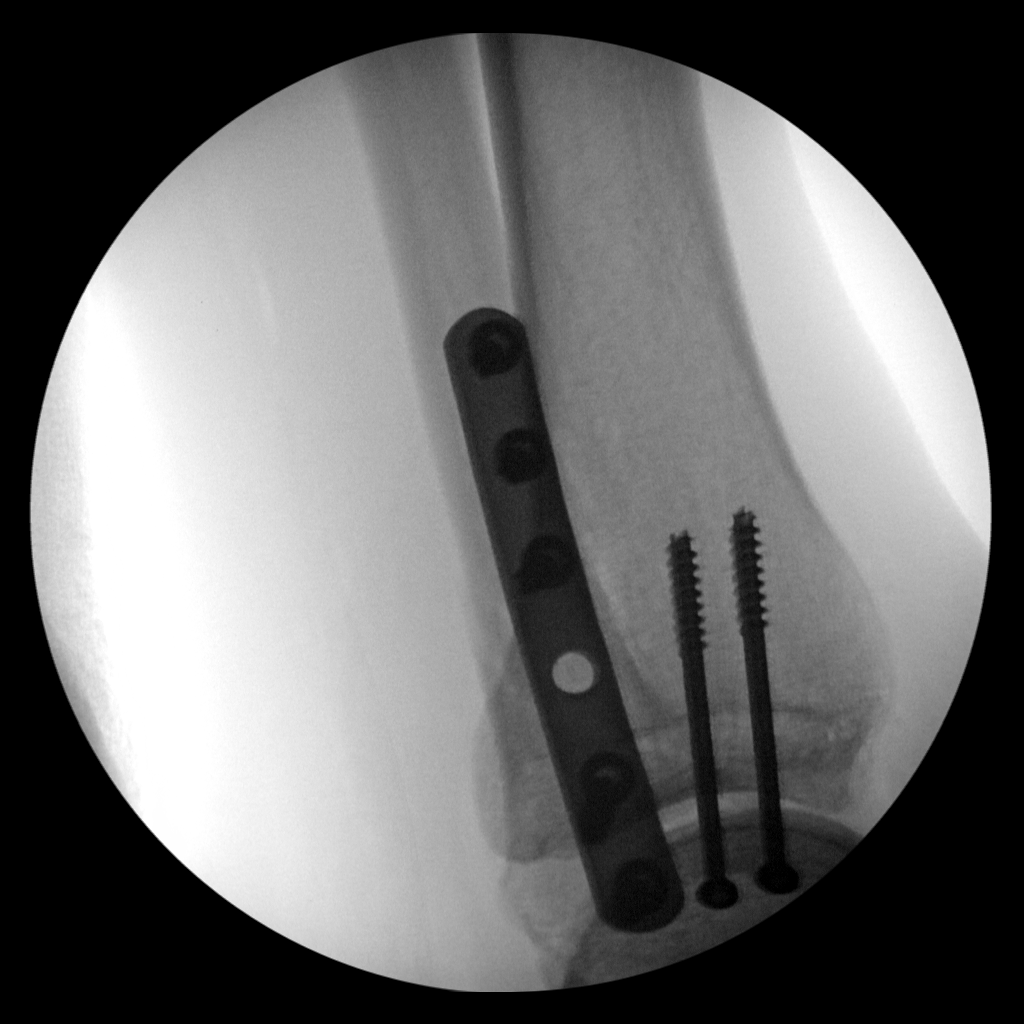

[4 of 4 positions shown; findings below may reference images not displayed]

FINDINGS: Four intraoperative fluoroscopic spot images. There are 2 cannulated
transmalleolar screws transfixing the medial malleolus in anatomic
alignment. There is a distal lateral fibular sideplate transfixing a
fibular fracture in anatomic alignment. The ankle mortise is intact.
IMPRESSION: Status post left ankle fracture ORIF.

## 2019-04-02 ENCOUNTER — Ambulatory Visit: Payer: Medicare Other | Attending: Internal Medicine

## 2019-04-02 DIAGNOSIS — Z23 Encounter for immunization: Secondary | ICD-10-CM | POA: Insufficient documentation

## 2019-04-02 NOTE — Progress Notes (Signed)
   Covid-19 Vaccination Clinic  Name:  Jack Jones    MRN: [id number] DOB: 09/17/1955  04/02/2019  [name] was observed post Covid-19 immunization for 15 minutes without incident. He was provided with Vaccine Information Sheet and instruction to access the V-Safe system.   [name] was instructed to call 911 with any severe reactions post vaccine: Marland Kitchen Difficulty breathing  . Swelling of face and throat  . A fast heartbeat  . A bad rash all over body  . Dizziness and weakness   Immunizations Administered    Name Date Dose VIS Date Route   Pfizer COVID-19 Vaccine 04/02/2019  8:38 AM 0.3 mL 01/09/2019 Intramuscular   Manufacturer: [employer], [employer]   Lot: [id number]   NDC: [address]-5

## 2019-04-27 ENCOUNTER — Ambulatory Visit: Payer: Medicare Other | Attending: Internal Medicine

## 2019-04-27 DIAGNOSIS — Z23 Encounter for immunization: Secondary | ICD-10-CM

## 2019-04-27 NOTE — Progress Notes (Signed)
   Covid-19 Vaccination Clinic  Name:  Jack Jones    MRN: [id number] DOB: Nov 12, 1955  04/27/2019  [name] was observed post Covid-19 immunization for 15 minutes without incident. He was provided with Vaccine Information Sheet and instruction to access the V-Safe system.   [name] was instructed to call 911 with any severe reactions post vaccine: Marland Kitchen Difficulty breathing  . Swelling of face and throat  . A fast heartbeat  . A bad rash all over body  . Dizziness and weakness   Immunizations Administered    Name Date Dose VIS Date Route   Pfizer COVID-19 Vaccine 04/27/2019  8:04 AM 0.3 mL 01/09/2019 Intramuscular   Manufacturer: [employer], [employer]   Lot: [id number]   NDC: [address]-1
